# Patient Record
Sex: Male | Born: 1975 | Race: White | Hispanic: No | Marital: Married
Health system: Southern US, Community
[De-identification: ages and names within clinical notes are randomized; demographics above are authoritative.]

---

## 2012-10-25 ENCOUNTER — Other Ambulatory Visit: Payer: Self-pay | Admitting: Family Medicine

## 2012-10-25 DIAGNOSIS — D367 Benign neoplasm of other specified sites: Secondary | ICD-10-CM

## 2012-10-31 ENCOUNTER — Ambulatory Visit
Admission: RE | Admit: 2012-10-31 | Discharge: 2012-10-31 | Disposition: A | Discharge status: Home / Self Care | Payer: BC Managed Care – PPO | Source: Ambulatory Visit | Attending: Family Medicine | Admitting: Family Medicine

## 2012-10-31 DIAGNOSIS — D367 Benign neoplasm of other specified sites: Secondary | ICD-10-CM

## 2016-12-24 DIAGNOSIS — H0015 Chalazion left lower eyelid: Secondary | ICD-10-CM | POA: Diagnosis not present

## 2017-01-17 DIAGNOSIS — H0015 Chalazion left lower eyelid: Secondary | ICD-10-CM | POA: Diagnosis not present

## 2017-01-25 DIAGNOSIS — M25512 Pain in left shoulder: Secondary | ICD-10-CM | POA: Diagnosis not present

## 2017-02-07 DIAGNOSIS — H0015 Chalazion left lower eyelid: Secondary | ICD-10-CM | POA: Diagnosis not present

## 2017-03-24 DIAGNOSIS — Z9181 History of falling: Secondary | ICD-10-CM | POA: Diagnosis not present

## 2017-03-24 DIAGNOSIS — M25511 Pain in right shoulder: Secondary | ICD-10-CM | POA: Diagnosis not present

## 2017-04-26 ENCOUNTER — Other Ambulatory Visit: Payer: Self-pay | Admitting: Orthopedic Surgery

## 2017-04-26 DIAGNOSIS — M25511 Pain in right shoulder: Secondary | ICD-10-CM

## 2017-05-04 ENCOUNTER — Ambulatory Visit
Admission: RE | Admit: 2017-05-04 | Discharge: 2017-05-04 | Disposition: A | Discharge status: Home / Self Care | Payer: BLUE CROSS/BLUE SHIELD | Source: Ambulatory Visit | Attending: Orthopedic Surgery | Admitting: Orthopedic Surgery

## 2017-05-04 DIAGNOSIS — M25511 Pain in right shoulder: Secondary | ICD-10-CM

## 2017-05-12 DIAGNOSIS — S43431A Superior glenoid labrum lesion of right shoulder, initial encounter: Secondary | ICD-10-CM | POA: Insufficient documentation

## 2017-05-19 DIAGNOSIS — M25511 Pain in right shoulder: Secondary | ICD-10-CM | POA: Diagnosis not present

## 2017-05-19 DIAGNOSIS — S43431A Superior glenoid labrum lesion of right shoulder, initial encounter: Secondary | ICD-10-CM | POA: Diagnosis not present

## 2017-12-15 DIAGNOSIS — J208 Acute bronchitis due to other specified organisms: Secondary | ICD-10-CM | POA: Diagnosis not present

## 2017-12-15 DIAGNOSIS — H6983 Other specified disorders of Eustachian tube, bilateral: Secondary | ICD-10-CM | POA: Diagnosis not present

## 2018-05-31 DIAGNOSIS — M7662 Achilles tendinitis, left leg: Secondary | ICD-10-CM | POA: Diagnosis not present

## 2018-07-15 IMAGING — MR MR SHOULDER*R* W/O CM
5 series · 31 of 40 positions shown · non-contrast
Comparison: None.

CLINICAL DATA: Right shoulder pain since a fall in [REDACTED] wall
skiing.

EXAM:
MRI OF THE RIGHT SHOULDER WITHOUT CONTRAST
TECHNIQUE: Multiplanar, multisequence MR imaging of the shoulder was performed.
No intravenous contrast was administered.

[Series 3: T2 fat-sat · axial · 4.0mm · 0.55mm/px · z∈[-21,+69]mm · 8 of 21 slices shown (1 of 3)]
[im 1/21]
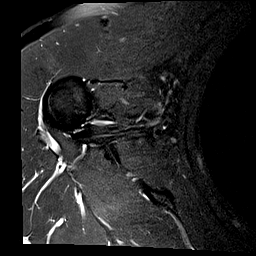
[im 3/21]
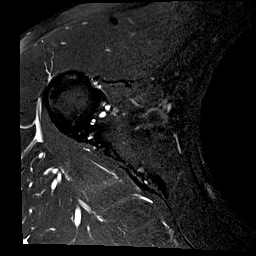
[im 7/21]
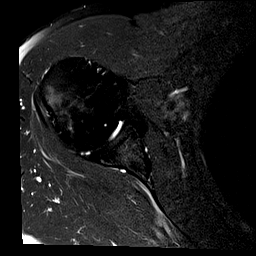
[im 9/21]
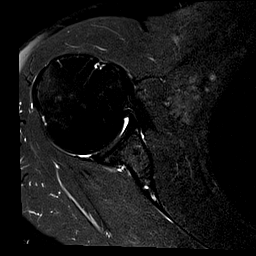
[im 12/21]
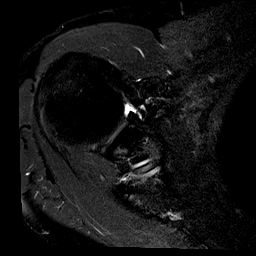
[im 14/21]
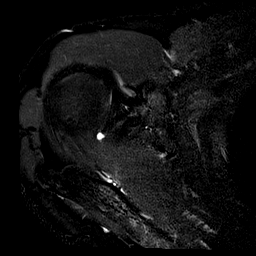
[im 18/21]
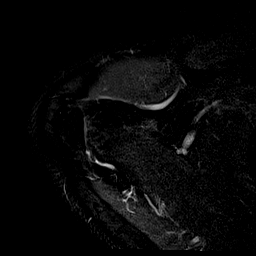
[im 21/21]
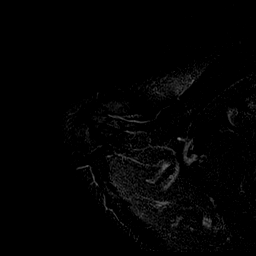

[Series 4: T1 · oblique · 4.0mm · 0.23mm/px · 1 of 20 slices shown]
[im 1/20]
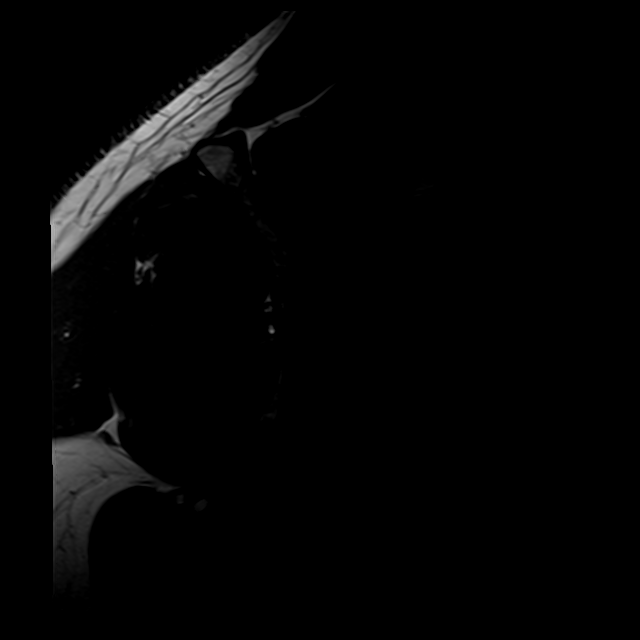

[Series 5: T2 fat-sat · oblique · 4.0mm · 0.59mm/px · 8 of 20 slices shown (2 of 3)]
[im 1/20]
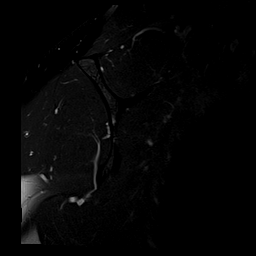
[im 3/20]
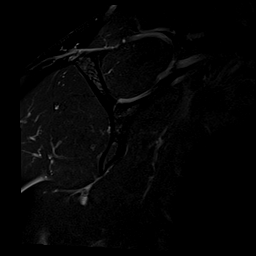
[im 6/20]
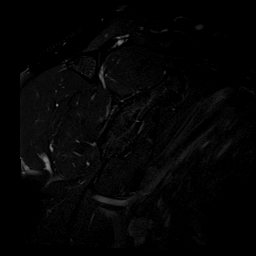
[im 9/20]
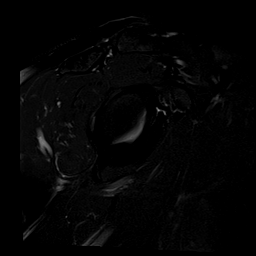
[im 11/20]
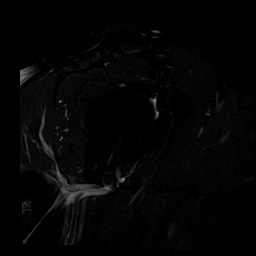
[im 14/20]
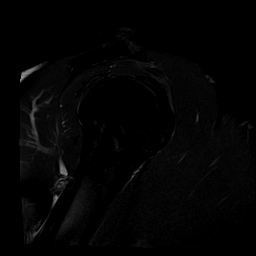
[im 17/20]
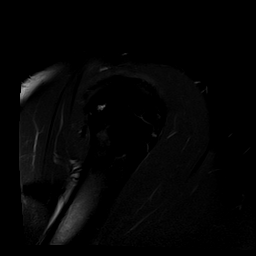
[im 20/20]
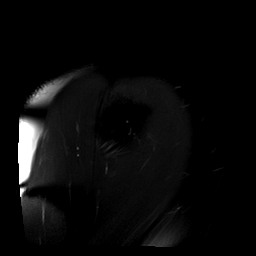

[Series 6: T2 fat-sat · oblique · 4.0mm · 0.59mm/px · 7 of 18 slices shown (3 of 3)]
[im 1/18]
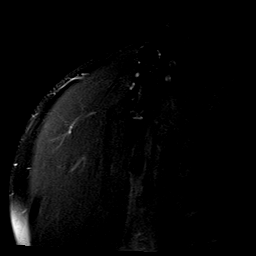
[im 3/18]
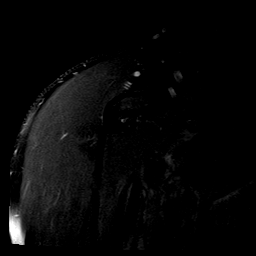
[im 6/18]
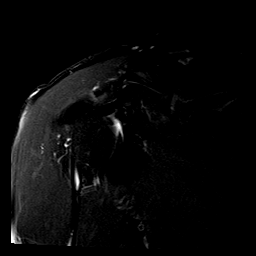
[im 9/18]
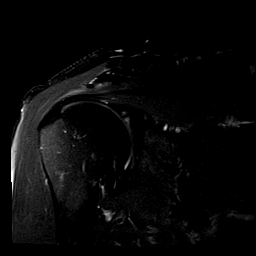
[im 12/18]
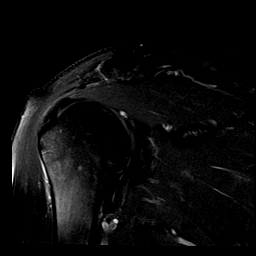
[im 15/18]
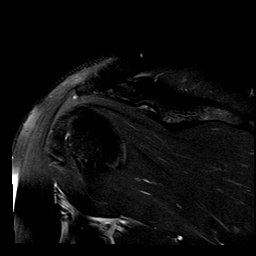
[im 18/18]
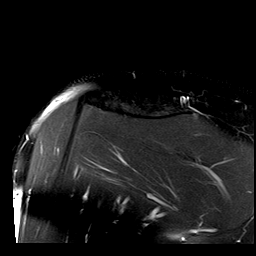

[Series 7: PD · oblique · 4.0mm · 0.23mm/px · 7 of 18 slices shown]
[im 1/18]
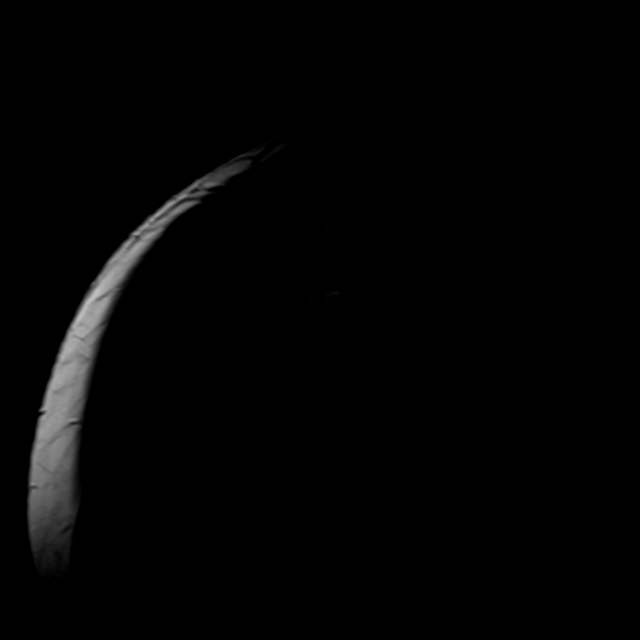
[im 3/18]
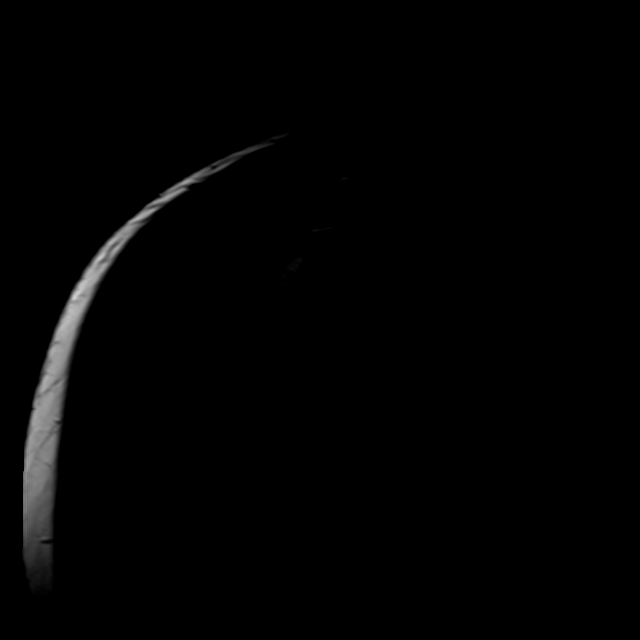
[im 6/18]
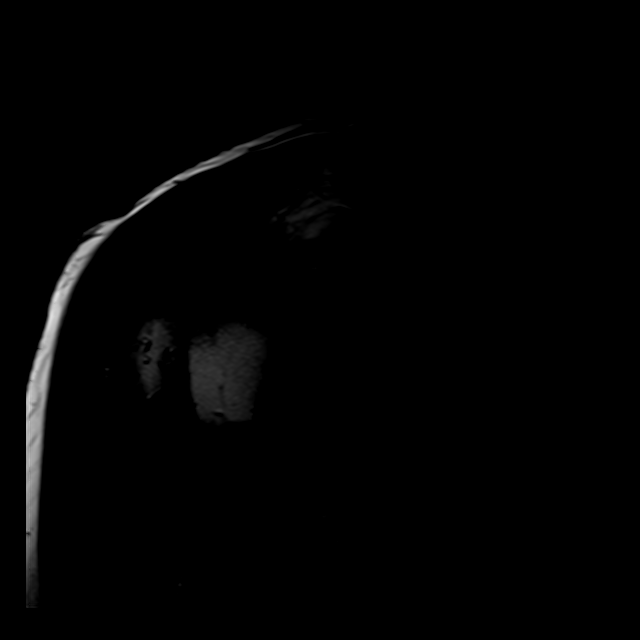
[im 9/18]
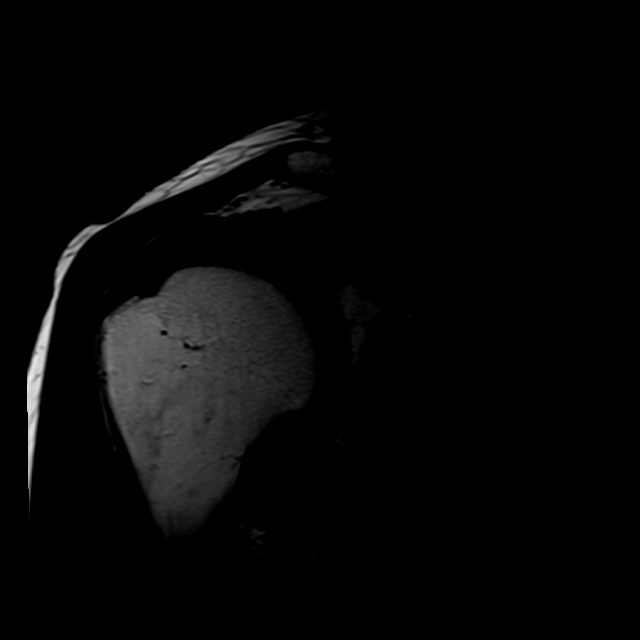
[im 12/18]
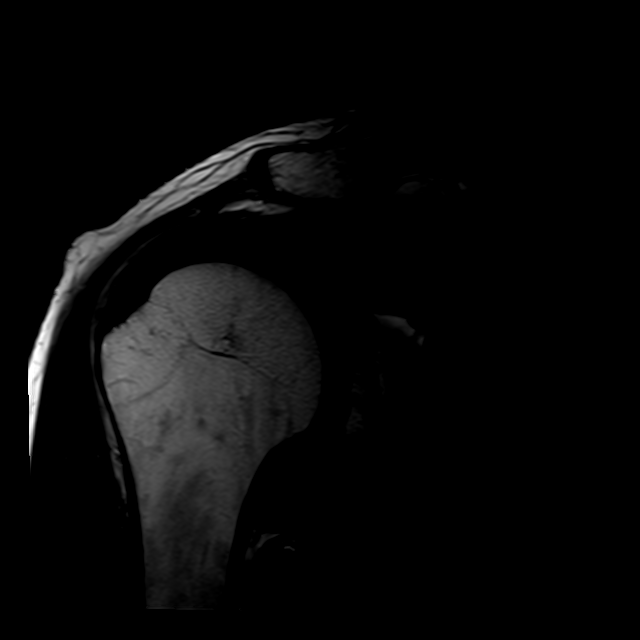
[im 15/18]
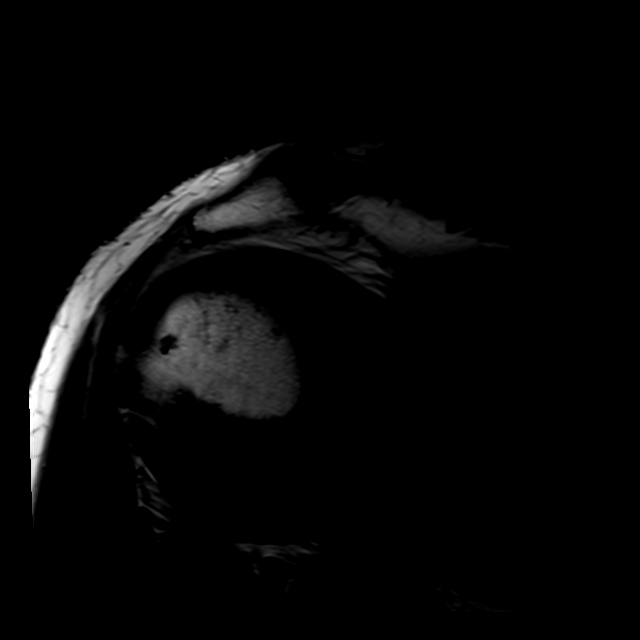
[im 18/18]
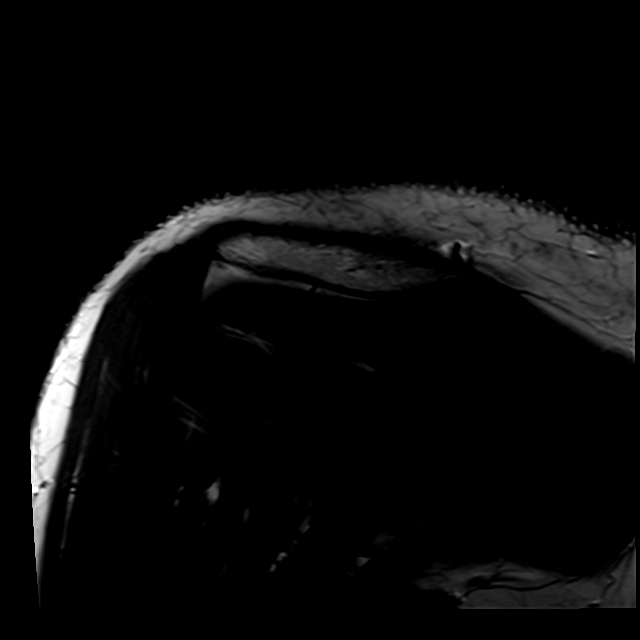

[31 of 40 positions shown; findings below may reference images not displayed]

FINDINGS: Rotator cuff: Mild rotator cuff tendinopathy/tendinosis with small
interstitial tears and mild bursal and articular surface fraying and
fibrillation. No partial or full-thickness rotator cuff tear is
identified.

Muscles:  Normal

Biceps long head:  Intact

Acromioclavicular Joint: No degenerative changes type 1 acromion.
Mild lateral downsloping but no undersurface spurring.

Glenohumeral Joint: No degenerative changes or joint effusion. There
is thickening of the capsular structures in the axillary recess
which can be seen with adhesive capsulitis or synovitis.

Labrum: There is a posterosuperior labral tear with a small
paralabral cyst. The anterior labrum appears normal.

Bones:  No acute bony findings.

Other: Mild subacromial/subdeltoid bursitis.
IMPRESSION: 1. Mild rotator cuff tendinopathy/tendinosis but no partial or
full-thickness tear.
2. Posterosuperior labral tear with associated small paralabral
cyst.
3. There is thickening of the capsular structures in the axillary
recess which can be seen with adhesive capsulitis or synovitis.
4. No acute bony findings.
5. Mild subacromial/subdeltoid bursitis.

## 2018-09-20 DIAGNOSIS — M6788 Other specified disorders of synovium and tendon, other site: Secondary | ICD-10-CM | POA: Diagnosis not present

## 2018-09-20 DIAGNOSIS — R102 Pelvic and perineal pain: Secondary | ICD-10-CM | POA: Diagnosis not present

## 2018-09-20 DIAGNOSIS — Z23 Encounter for immunization: Secondary | ICD-10-CM | POA: Diagnosis not present

## 2018-09-27 ENCOUNTER — Ambulatory Visit: Payer: Self-pay

## 2018-09-27 ENCOUNTER — Encounter: Payer: Self-pay | Admitting: Family Medicine

## 2018-09-27 ENCOUNTER — Other Ambulatory Visit: Payer: Self-pay

## 2018-09-27 ENCOUNTER — Ambulatory Visit (INDEPENDENT_AMBULATORY_CARE_PROVIDER_SITE_OTHER): Payer: BC Managed Care – PPO | Admitting: Family Medicine

## 2018-09-27 VITALS — BP 122/82 | Ht 72.0 in | Wt 200.0 lb

## 2018-09-27 DIAGNOSIS — S86002A Unspecified injury of left Achilles tendon, initial encounter: Secondary | ICD-10-CM

## 2018-09-27 NOTE — Patient Instructions (Signed)
You have mild Achilles Tendinopathy Ibuprofen 600mg  three times a day with food OR aleve 2 tabs twice a day with food for pain and inflammation only if needed. Calf raises 3 sets of 10 on level ground once a day first. When these are easy, can do them one legged 3 sets of 10. Finally advance to doing them on a step dropping and raising. Can add heel walks, toe walks forward and backward as well Icing 15 minutes at a time as needed. Avoid uneven ground, hills as much as possible. Heel lifts in shoes or shoes with a natural heel lift can be helpful if this worsens. Consider physical therapy, orthotics, nitro patches if not improving as expected. Follow up in 6 weeks.

## 2018-09-27 NOTE — Progress Notes (Signed)
PCP: Patient, No Pcp Per  Subjective:   HPI: Patient is a 43 y.o. male here for evaluation of left Achilles pain.  Patient's pain has been present for the last 6 months.  He denies any specific injury or trauma but is a runner which he believes aggravated his symptoms.  Patient got new running shoes in March and also increased the amount of mileage he was running.  He has since taken several months off of running to rest his Achilles.  He notes his pain is improved significantly but comes in today to make sure there is no partial tear or that he will not further injure his Achilles by starting to run again.  The pain is located posteriorly in his ankle and does not radiate.  He denies any bruising or swelling.  He has no associated numbness or tingling.  He is not taking anything for the pain is not done any sort of therapy for his Achilles other than rest.   Review of Systems: See HPI above.  History reviewed. No pertinent past medical history.  No current outpatient medications on file prior to visit.   No current facility-administered medications on file prior to visit.     History reviewed. No pertinent surgical history.  Allergies  Allergen Reactions  . Penicillins Other (See Comments) and Rash    RAsh     Social History   Socioeconomic History  . Marital status: Married    Spouse name: Not on file  . Number of children: Not on file  . Years of education: Not on file  . Highest education level: Not on file  Occupational History  . Not on file  Social Needs  . Financial resource strain: Not on file  . Food insecurity    Worry: Not on file    Inability: Not on file  . Transportation needs    Medical: Not on file    Non-medical: Not on file  Tobacco Use  . Smoking status: Not on file  Substance and Sexual Activity  . Alcohol use: Not on file  . Drug use: Not on file  . Sexual activity: Not on file  Lifestyle  . Physical activity    Days per week: Not on file   Minutes per session: Not on file  . Stress: Not on file  Relationships  . Social Herbalist on phone: Not on file    Gets together: Not on file    Attends religious service: Not on file    Active member of club or organization: Not on file    Attends meetings of clubs or organizations: Not on file    Relationship status: Not on file  . Intimate partner violence    Fear of current or ex partner: Not on file    Emotionally abused: Not on file    Physically abused: Not on file    Forced sexual activity: Not on file  Other Topics Concern  . Not on file  Social History Narrative  . Not on file    History reviewed. No pertinent family history.      Objective:  Physical Exam: BP 122/82   Ht 6' (1.829 m)   Wt 200 lb (90.7 kg)   BMI 27.12 kg/m  Gen: NAD, comfortable in exam room Lungs: Breathing comfortably on room air Ankle/Foot Exam Left -Inspection: No deformity, no discoloration. Normal longitudinal and transverse arch -Palpation: No tenderness over the Achilles tendon -ROM: Dorsiflexion: 20 degrees; plantarflexion: 50  degrees; Inversion: 35 degrees; Eversion: 25 degrees -Strength: Dorsiflexion: 5/5; Plantarflexion: 5/5; Inversion: 5/5; Eversion: 5/5 -Special Tests: Anterior drawer: negative; Talar tilt: Negative; Thompson test: Normal movement and plantarflexion -Limb neurovascularly intact  Contralateral Ankle -Inspection: No deformity, no discoloration. Normal longitudinal and transverse arch -Palpation: Medial malleolus: non-tender; lateral malleolus: non-tender; 5th metatarsal: non-tender; calcaneus: non-tender; plantar fascia insertion: non-tender -ROM: Dorsiflexion: 20 degrees; plantarflexion: 50 degrees; Inversion: 35 degrees; Eversion: 25 degrees -Strength: Dorsiflexion: 5/5; Plantarflexion: 5/5; Inversion: 5/5; Eversion: 5/5 -Limb neurovascularly intact  -Gait: Normal    Limited diagnostic ultrasound of the left Achilles Findings: - Normal  appearance of the Achilles tendon.  No tears noted -Very small amount of retrocalcaneal bursa swelling - No neovascularization in the Achilles tendon -Achilles tendon measured 0.5 cm in thickness - Spurring noted at the posterior aspect of the calcaneus Impression: - Bone spurs noted at the calcaneus, otherwise normal ultrasound exam   Assessment & Plan:  Patient is a 43 y.o. male here for evaluation of left Achilles pain  1.  Left Achilles tendinopathy - No tears seen on ultrasound -Patient given eccentric exercises for strengthening of his Achilles -Patient instructed to avoid running on hills if possible.  Patient may gradually return to running as tolerated -Patient may take anti-inflammatories as needed for pain -If patient does not have any improvement in his symptoms we will consider adding nitroglycerin patch to his current treatment  Follow-up in 4 to 6 weeks as needed

## 2018-09-28 ENCOUNTER — Encounter: Payer: Self-pay | Admitting: Family Medicine

## 2018-11-01 ENCOUNTER — Other Ambulatory Visit: Payer: Self-pay

## 2018-11-01 ENCOUNTER — Ambulatory Visit: Payer: BC Managed Care – PPO | Admitting: Family Medicine

## 2018-11-01 VITALS — BP 120/76 | Ht 72.0 in | Wt 200.0 lb

## 2018-11-01 DIAGNOSIS — M7662 Achilles tendinitis, left leg: Secondary | ICD-10-CM | POA: Diagnosis not present

## 2018-11-01 MED ORDER — NITROGLYCERIN 0.2 MG/HR TD PT24: MEDICATED_PATCH | TRANSDERMAL | 1 refills | Status: DC

## 2018-11-01 NOTE — Patient Instructions (Signed)
Start nitro patches 1/4th patch to affected area, change daily. Heel lifts when up and walking around to help unload the area. Do the home exercises daily. Be more diligent with the icing. Follow up with me in 6 weeks. Let me know if you want to go ahead with physical therapy before I see you back.

## 2018-11-01 NOTE — Progress Notes (Signed)
PCP: Patient, No Pcp Per  Subjective:   HPI: 9/23: Patient is a 43 y.o. male here for evaluation of left Achilles pain.  Patient's pain has been present for the last 6 months.  He denies any specific injury or trauma but is a runner which he believes aggravated his symptoms.  Patient got new running shoes in March and also increased the amount of mileage he was running.  He has since taken several months off of running to rest his Achilles.  He notes his pain is improved significantly but comes in today to make sure there is no partial tear or that he will not further injure his Achilles by starting to run again.  The pain is located posteriorly in his ankle and does not radiate.  He denies any bruising or swelling.  He has no associated numbness or tingling.  He is not taking anything for the pain is not done any sort of therapy for his Achilles other than rest.  10/28: Patient reports he's doing well. Doing home exercises every other day. Still with tightness and pain in his achilles - feels he can be more diligent about icing and home exercises. Pain doesn't stop him from running. No swelling, skin changes.  Review of Systems: See HPI above.  History reviewed. No pertinent past medical history.  No current outpatient medications on file prior to visit.   No current facility-administered medications on file prior to visit.     History reviewed. No pertinent surgical history.  Allergies  Allergen Reactions  . Penicillins Other (See Comments) and Rash    RAsh     Social History   Socioeconomic History  . Marital status: Married    Spouse name: Not on file  . Number of children: Not on file  . Years of education: Not on file  . Highest education level: Not on file  Occupational History  . Not on file  Social Needs  . Financial resource strain: Not on file  . Food insecurity    Worry: Not on file    Inability: Not on file  . Transportation needs    Medical: Not on file     Non-medical: Not on file  Tobacco Use  . Smoking status: Not on file  Substance and Sexual Activity  . Alcohol use: Not on file  . Drug use: Not on file  . Sexual activity: Not on file  Lifestyle  . Physical activity    Days per week: Not on file    Minutes per session: Not on file  . Stress: Not on file  Relationships  . Social Herbalist on phone: Not on file    Gets together: Not on file    Attends religious service: Not on file    Active member of club or organization: Not on file    Attends meetings of clubs or organizations: Not on file    Relationship status: Not on file  . Intimate partner violence    Fear of current or ex partner: Not on file    Emotionally abused: Not on file    Physically abused: Not on file    Forced sexual activity: Not on file  Other Topics Concern  . Not on file  Social History Narrative  . Not on file    History reviewed. No pertinent family history.      Objective:  Physical Exam: BP 120/76   Ht 6' (1.829 m)   Wt 200 lb (90.7  kg)   BMI 27.12 kg/m   Gen: NAD, comfortable in exam room  Left foot/ankle: No gross deformity, swelling, ecchymoses FROM with 5/5 strength without pain. Minimal TTP achilles 2-3 cm proximal to insertion. Negative ant drawer and talar tilt.   Thompsons test negative. NV intact distally.   Assessment & Plan:  Patient is a 43 y.o. male here for evaluation of left Achilles pain  1.  Left achilles tendinopathy - discussed options.  He will continue with HEP but will add heel lifts, nitro patches (discussed risks of headache, skin irritation).  Consider physical therapy.  Icing, tylenol, ibuprofen if needed.  F/u in 6 weeks.

## 2018-11-02 ENCOUNTER — Encounter: Payer: Self-pay | Admitting: Family Medicine

## 2019-01-02 ENCOUNTER — Other Ambulatory Visit: Payer: Self-pay | Admitting: Family Medicine

## 2019-01-02 DIAGNOSIS — J019 Acute sinusitis, unspecified: Secondary | ICD-10-CM | POA: Diagnosis not present

## 2019-01-22 ENCOUNTER — Other Ambulatory Visit: Payer: Self-pay | Admitting: Family Medicine

## 2019-02-01 DIAGNOSIS — Z0000 Encounter for general adult medical examination without abnormal findings: Secondary | ICD-10-CM | POA: Diagnosis not present

## 2019-02-02 DIAGNOSIS — Z20828 Contact with and (suspected) exposure to other viral communicable diseases: Secondary | ICD-10-CM | POA: Diagnosis not present

## 2019-02-02 DIAGNOSIS — Z20822 Contact with and (suspected) exposure to covid-19: Secondary | ICD-10-CM | POA: Diagnosis not present

## 2019-02-15 DIAGNOSIS — Z1329 Encounter for screening for other suspected endocrine disorder: Secondary | ICD-10-CM | POA: Diagnosis not present

## 2019-02-15 DIAGNOSIS — Z23 Encounter for immunization: Secondary | ICD-10-CM | POA: Diagnosis not present

## 2019-02-15 DIAGNOSIS — Z125 Encounter for screening for malignant neoplasm of prostate: Secondary | ICD-10-CM | POA: Diagnosis not present

## 2019-02-15 DIAGNOSIS — Z789 Other specified health status: Secondary | ICD-10-CM | POA: Diagnosis not present

## 2019-02-15 DIAGNOSIS — Z13220 Encounter for screening for lipoid disorders: Secondary | ICD-10-CM | POA: Diagnosis not present

## 2019-03-06 DIAGNOSIS — E871 Hypo-osmolality and hyponatremia: Secondary | ICD-10-CM | POA: Diagnosis not present

## 2019-03-27 DIAGNOSIS — D225 Melanocytic nevi of trunk: Secondary | ICD-10-CM | POA: Diagnosis not present

## 2019-03-27 DIAGNOSIS — L814 Other melanin hyperpigmentation: Secondary | ICD-10-CM | POA: Diagnosis not present

## 2019-03-27 DIAGNOSIS — D1801 Hemangioma of skin and subcutaneous tissue: Secondary | ICD-10-CM | POA: Diagnosis not present

## 2019-03-27 DIAGNOSIS — D2261 Melanocytic nevi of right upper limb, including shoulder: Secondary | ICD-10-CM | POA: Diagnosis not present

## 2019-03-27 DIAGNOSIS — B081 Molluscum contagiosum: Secondary | ICD-10-CM | POA: Diagnosis not present

## 2019-04-02 DIAGNOSIS — Z20822 Contact with and (suspected) exposure to covid-19: Secondary | ICD-10-CM | POA: Diagnosis not present

## 2019-11-15 DIAGNOSIS — H6983 Other specified disorders of Eustachian tube, bilateral: Secondary | ICD-10-CM | POA: Diagnosis not present

## 2019-11-15 DIAGNOSIS — H93293 Other abnormal auditory perceptions, bilateral: Secondary | ICD-10-CM | POA: Diagnosis not present

## 2019-11-15 DIAGNOSIS — R040 Epistaxis: Secondary | ICD-10-CM | POA: Diagnosis not present

## 2019-12-06 DIAGNOSIS — G5621 Lesion of ulnar nerve, right upper limb: Secondary | ICD-10-CM | POA: Diagnosis not present

## 2019-12-06 DIAGNOSIS — M7701 Medial epicondylitis, right elbow: Secondary | ICD-10-CM | POA: Diagnosis not present

## 2019-12-26 DIAGNOSIS — Z03818 Encounter for observation for suspected exposure to other biological agents ruled out: Secondary | ICD-10-CM | POA: Diagnosis not present

## 2019-12-26 DIAGNOSIS — Z20822 Contact with and (suspected) exposure to covid-19: Secondary | ICD-10-CM | POA: Diagnosis not present

## 2020-02-07 DIAGNOSIS — Z0000 Encounter for general adult medical examination without abnormal findings: Secondary | ICD-10-CM | POA: Diagnosis not present

## 2020-02-21 DIAGNOSIS — Z0000 Encounter for general adult medical examination without abnormal findings: Secondary | ICD-10-CM | POA: Diagnosis not present

## 2020-02-21 DIAGNOSIS — Z13220 Encounter for screening for lipoid disorders: Secondary | ICD-10-CM | POA: Diagnosis not present

## 2020-02-21 DIAGNOSIS — Z125 Encounter for screening for malignant neoplasm of prostate: Secondary | ICD-10-CM | POA: Diagnosis not present

## 2020-03-26 DIAGNOSIS — D224 Melanocytic nevi of scalp and neck: Secondary | ICD-10-CM | POA: Diagnosis not present

## 2020-03-26 DIAGNOSIS — D225 Melanocytic nevi of trunk: Secondary | ICD-10-CM | POA: Diagnosis not present

## 2020-03-26 DIAGNOSIS — D2262 Melanocytic nevi of left upper limb, including shoulder: Secondary | ICD-10-CM | POA: Diagnosis not present

## 2020-03-26 DIAGNOSIS — D2261 Melanocytic nevi of right upper limb, including shoulder: Secondary | ICD-10-CM | POA: Diagnosis not present

## 2020-06-10 DIAGNOSIS — R0982 Postnasal drip: Secondary | ICD-10-CM | POA: Diagnosis not present

## 2021-03-26 DIAGNOSIS — D225 Melanocytic nevi of trunk: Secondary | ICD-10-CM | POA: Diagnosis not present

## 2021-03-26 DIAGNOSIS — L814 Other melanin hyperpigmentation: Secondary | ICD-10-CM | POA: Diagnosis not present

## 2021-03-26 DIAGNOSIS — D2261 Melanocytic nevi of right upper limb, including shoulder: Secondary | ICD-10-CM | POA: Diagnosis not present

## 2021-03-30 DIAGNOSIS — M25511 Pain in right shoulder: Secondary | ICD-10-CM | POA: Diagnosis not present

## 2021-04-21 DIAGNOSIS — M25511 Pain in right shoulder: Secondary | ICD-10-CM | POA: Diagnosis not present

## 2021-05-04 DIAGNOSIS — M25511 Pain in right shoulder: Secondary | ICD-10-CM | POA: Diagnosis not present

## 2021-05-20 DIAGNOSIS — Z130 Encounter for screening for diseases of the blood and blood-forming organs and certain disorders involving the immune mechanism: Secondary | ICD-10-CM | POA: Diagnosis not present

## 2021-05-20 DIAGNOSIS — Z13220 Encounter for screening for lipoid disorders: Secondary | ICD-10-CM | POA: Diagnosis not present

## 2021-05-20 DIAGNOSIS — Z0000 Encounter for general adult medical examination without abnormal findings: Secondary | ICD-10-CM | POA: Diagnosis not present

## 2021-05-20 DIAGNOSIS — Z125 Encounter for screening for malignant neoplasm of prostate: Secondary | ICD-10-CM | POA: Diagnosis not present

## 2021-05-20 DIAGNOSIS — R350 Frequency of micturition: Secondary | ICD-10-CM | POA: Diagnosis not present

## 2021-06-04 DIAGNOSIS — M545 Low back pain, unspecified: Secondary | ICD-10-CM | POA: Insufficient documentation

## 2022-01-19 DIAGNOSIS — Z1211 Encounter for screening for malignant neoplasm of colon: Secondary | ICD-10-CM | POA: Diagnosis not present

## 2022-01-19 DIAGNOSIS — K5730 Diverticulosis of large intestine without perforation or abscess without bleeding: Secondary | ICD-10-CM | POA: Diagnosis not present

## 2022-04-01 DIAGNOSIS — D2262 Melanocytic nevi of left upper limb, including shoulder: Secondary | ICD-10-CM | POA: Diagnosis not present

## 2022-04-01 DIAGNOSIS — D225 Melanocytic nevi of trunk: Secondary | ICD-10-CM | POA: Diagnosis not present

## 2022-04-01 DIAGNOSIS — D2271 Melanocytic nevi of right lower limb, including hip: Secondary | ICD-10-CM | POA: Diagnosis not present

## 2022-04-01 DIAGNOSIS — D2261 Melanocytic nevi of right upper limb, including shoulder: Secondary | ICD-10-CM | POA: Diagnosis not present

## 2022-07-07 DIAGNOSIS — S2239XB Fracture of one rib, unspecified side, initial encounter for open fracture: Secondary | ICD-10-CM | POA: Diagnosis not present

## 2022-07-12 DIAGNOSIS — S2239XB Fracture of one rib, unspecified side, initial encounter for open fracture: Secondary | ICD-10-CM | POA: Diagnosis not present

## 2022-07-14 ENCOUNTER — Other Ambulatory Visit (HOSPITAL_COMMUNITY): Payer: Self-pay | Admitting: Orthopedic Surgery

## 2022-07-14 DIAGNOSIS — M79603 Pain in arm, unspecified: Secondary | ICD-10-CM | POA: Insufficient documentation

## 2022-07-14 DIAGNOSIS — I82611 Acute embolism and thrombosis of superficial veins of right upper extremity: Secondary | ICD-10-CM | POA: Diagnosis not present

## 2022-07-14 DIAGNOSIS — M79601 Pain in right arm: Secondary | ICD-10-CM

## 2022-07-14 DIAGNOSIS — S2242XA Multiple fractures of ribs, left side, initial encounter for closed fracture: Secondary | ICD-10-CM | POA: Diagnosis not present

## 2022-07-14 DIAGNOSIS — M79602 Pain in left arm: Secondary | ICD-10-CM | POA: Diagnosis not present

## 2022-07-15 ENCOUNTER — Ambulatory Visit (HOSPITAL_COMMUNITY)
Admission: RE | Admit: 2022-07-15 | Discharge: 2022-07-15 | Disposition: A | Discharge status: Home / Self Care | Payer: BC Managed Care – PPO | Source: Ambulatory Visit | Attending: Orthopedic Surgery | Admitting: Orthopedic Surgery

## 2022-07-15 ENCOUNTER — Other Ambulatory Visit: Payer: Self-pay

## 2022-07-15 DIAGNOSIS — S2242XA Multiple fractures of ribs, left side, initial encounter for closed fracture: Secondary | ICD-10-CM

## 2022-07-15 DIAGNOSIS — S2249XA Multiple fractures of ribs, unspecified side, initial encounter for closed fracture: Secondary | ICD-10-CM | POA: Insufficient documentation

## 2022-07-15 DIAGNOSIS — M79601 Pain in right arm: Secondary | ICD-10-CM | POA: Diagnosis not present

## 2022-07-15 DIAGNOSIS — I82619 Acute embolism and thrombosis of superficial veins of unspecified upper extremity: Secondary | ICD-10-CM | POA: Insufficient documentation

## 2022-07-15 NOTE — Progress Notes (Signed)
Right upper extremity venous study completed.   Preliminary results relayed to [facility] at Dr. Ranell Patrick' office.  Please see CV Procedures for preliminary results.  Crystina Borrayo, RVT  11:03 AM 07/15/22

## 2022-07-16 ENCOUNTER — Other Ambulatory Visit: Payer: Self-pay | Admitting: Physician Assistant

## 2022-07-16 DIAGNOSIS — S2242XA Multiple fractures of ribs, left side, initial encounter for closed fracture: Secondary | ICD-10-CM

## 2022-07-21 ENCOUNTER — Other Ambulatory Visit: Payer: BC Managed Care – PPO

## 2022-07-22 ENCOUNTER — Ambulatory Visit
Admission: RE | Admit: 2022-07-22 | Discharge: 2022-07-22 | Disposition: A | Discharge status: Home / Self Care | Payer: BC Managed Care – PPO | Source: Ambulatory Visit | Attending: Physician Assistant | Admitting: Physician Assistant

## 2022-07-22 DIAGNOSIS — S2242XA Multiple fractures of ribs, left side, initial encounter for closed fracture: Secondary | ICD-10-CM

## 2022-07-22 DIAGNOSIS — S2249XA Multiple fractures of ribs, unspecified side, initial encounter for closed fracture: Secondary | ICD-10-CM | POA: Diagnosis not present

## 2022-07-22 DIAGNOSIS — M549 Dorsalgia, unspecified: Secondary | ICD-10-CM | POA: Diagnosis not present

## 2022-07-23 ENCOUNTER — Other Ambulatory Visit: Payer: Self-pay | Admitting: Cardiothoracic Surgery

## 2022-07-23 DIAGNOSIS — S2242XA Multiple fractures of ribs, left side, initial encounter for closed fracture: Secondary | ICD-10-CM

## 2022-07-26 ENCOUNTER — Ambulatory Visit
Admission: RE | Admit: 2022-07-26 | Discharge: 2022-07-26 | Disposition: A | Discharge status: Home / Self Care | Payer: BC Managed Care – PPO | Source: Ambulatory Visit | Attending: Cardiothoracic Surgery | Admitting: Cardiothoracic Surgery

## 2022-07-26 ENCOUNTER — Institutional Professional Consult (permissible substitution): Payer: BC Managed Care – PPO | Admitting: Cardiothoracic Surgery

## 2022-07-26 ENCOUNTER — Encounter: Payer: Self-pay | Admitting: Cardiothoracic Surgery

## 2022-07-26 VITALS — BP 130/87 | HR 71 | Resp 18 | Ht 72.0 in | Wt 196.0 lb

## 2022-07-26 DIAGNOSIS — R0781 Pleurodynia: Secondary | ICD-10-CM

## 2022-07-26 DIAGNOSIS — R0789 Other chest pain: Secondary | ICD-10-CM | POA: Insufficient documentation

## 2022-07-26 DIAGNOSIS — S2249XA Multiple fractures of ribs, unspecified side, initial encounter for closed fracture: Secondary | ICD-10-CM | POA: Diagnosis not present

## 2022-07-26 DIAGNOSIS — S2242XA Multiple fractures of ribs, left side, initial encounter for closed fracture: Secondary | ICD-10-CM

## 2022-07-26 MED ORDER — LIDOCAINE 5 % EX PTCH: 1.0000 | MEDICATED_PATCH | CUTANEOUS | 1 refills | Status: DC

## 2022-07-26 MED ORDER — TRAMADOL HCL 50 MG PO TABS: 50.0000 mg | ORAL_TABLET | Freq: Four times a day (QID) | ORAL | 1 refills | Status: DC | PRN

## 2022-07-26 NOTE — Progress Notes (Signed)
HPI: Patient examined, images of recent chest CT scans performed both in [place] and locally personally reviewed and discussed with patient. On July 3 the patient while on vacation in [place] slipped on a catamarran landing on a stairway with injury to his left lower back.  He sustained minimally displaced fractures of the left eighth and ninth ribs medially close to the spine.  There is some pleural thickening and mild localized effusion at the site of injury but no significant pleural effusion or pneumothorax.  A follow-up chest x-ray today again shows clear lung fields.  The patient has some clicking and popping sensation the area of injury.  He has some injury related difficulties with deep inspiration but can do his normal daily activities.  He has been taking tramadol and Tylenol for pain.  Current Outpatient Medications  Medication Sig Dispense Refill   acetaminophen (TYLENOL) 325 MG tablet Take 650 mg by mouth every 6 (six) hours as needed.     aspirin EC 81 MG tablet Take 81 mg by mouth daily. Swallow whole.     fluticasone (FLONASE) 50 MCG/ACT nasal spray two sprays by Both Nostrils route daily.     traMADol (ULTRAM) 50 MG tablet Take 50 mg by mouth every 6 (six) hours as needed.     No current facility-administered medications for this visit.     Physical Exam: Blood pressure 130/87, pulse 71, resp. rate 18, height 6' (1.829 m), weight 196 lb (88.9 kg), SpO2 96%.         Exam    General- alert and comfortable    Neck- no JVD, no cervical adenopathy palpable, no carotid bruit   Lungs- clear without rales, wheezes   Cor- regular rate and rhythm, no murmur , gallop   Abdomen- soft, non-tender   Extremities - warm, non-tender, minimal edema, indurated area above the right brachial fossa at the site of IV contrast infiltration now without fluctuance erythema or significant tenderness.   Neuro- oriented, appropriate, no focal weakness  Diagnostic Tests: Recent CT scan of chest  shows minimally displaced posterior fractures of the left eighth and ninth ribs.  Impression: Left lower medial rib fractures of ribs 8 9 will heal and do not meet criteria for surgical plating.  Plan on restricting activities as discussed with patient as well as using Tylenol and tramadol for discomfort.  Will also use Lidoderm patch once a day over the area of the injury.  Plan: Return with chest x-ray in 4 weeks.  He will call us if there are any changes or concerns.   Lovett Sox, MD [facility] [phone]

## 2022-08-06 ENCOUNTER — Telehealth: Payer: Self-pay

## 2022-08-06 NOTE — Telephone Encounter (Signed)
Patient contacted the office with questions on prescription of Lidocaine patches. Advised prior authorization has been faxed to [facility]. Also advised that he can use over the counter lidocaine patches until authorization is completed. He acknowledged receipt.

## 2022-08-23 ENCOUNTER — Ambulatory Visit: Payer: BC Managed Care – PPO | Admitting: Cardiothoracic Surgery

## 2022-08-25 ENCOUNTER — Other Ambulatory Visit: Payer: Self-pay | Admitting: Cardiothoracic Surgery

## 2022-08-25 DIAGNOSIS — S2242XA Multiple fractures of ribs, left side, initial encounter for closed fracture: Secondary | ICD-10-CM

## 2022-08-26 ENCOUNTER — Ambulatory Visit: Payer: BC Managed Care – PPO | Admitting: Cardiothoracic Surgery

## 2022-08-26 ENCOUNTER — Ambulatory Visit
Admission: RE | Admit: 2022-08-26 | Discharge: 2022-08-26 | Disposition: A | Discharge status: Home / Self Care | Payer: BC Managed Care – PPO | Source: Ambulatory Visit | Attending: Cardiothoracic Surgery | Admitting: Cardiothoracic Surgery

## 2022-08-26 ENCOUNTER — Encounter: Payer: Self-pay | Admitting: Cardiothoracic Surgery

## 2022-08-26 VITALS — BP 111/78 | HR 74 | Resp 20 | Ht 72.0 in | Wt 195.0 lb

## 2022-08-26 DIAGNOSIS — S2242XD Multiple fractures of ribs, left side, subsequent encounter for fracture with routine healing: Secondary | ICD-10-CM | POA: Diagnosis not present

## 2022-08-26 DIAGNOSIS — J929 Pleural plaque without asbestos: Secondary | ICD-10-CM | POA: Insufficient documentation

## 2022-08-26 DIAGNOSIS — S2242XA Multiple fractures of ribs, left side, initial encounter for closed fracture: Secondary | ICD-10-CM | POA: Diagnosis not present

## 2022-08-26 DIAGNOSIS — J984 Other disorders of lung: Secondary | ICD-10-CM | POA: Diagnosis not present

## 2022-08-26 NOTE — Progress Notes (Signed)
HPI: The patient returns for 93-month follow-up with chest x-ray after falling and fracturing  2 lower left ribs (9, 10). His pain has significantly improved and no longer needs tramadol.  He is doing normal activities but not doing any heavy lifting.  He has been hesitant to lay on his side or prone to sleep but he can safely position himself so that he is comfortable.  He has sensation of some motion in his lower back probably related to the mildly displaced ribs.  I reviewed follow-up chest x-ray today which shows no significant pleural effusion and a small thin linear density in the area of injury probably related to pleural thickening/scar. Current Outpatient Medications  Medication Sig Dispense Refill   acetaminophen (TYLENOL) 325 MG tablet Take 650 mg by mouth every 6 (six) hours as needed.     aspirin EC 81 MG tablet Take 81 mg by mouth daily. Swallow whole.     fluticasone (FLONASE) 50 MCG/ACT nasal spray two sprays by Both Nostrils route daily.     traMADol (ULTRAM) 50 MG tablet Take 1 tablet (50 mg total) by mouth every 6 (six) hours as needed. 30 tablet 1   No current facility-administered medications for this visit.     Physical Exam  :Blood pressure 111/78, pulse 74, resp. rate 20, height 6' (1.829 m), weight 195 lb (88.5 kg), SpO2 96%.        Exam    General- alert and comfortable    Neck- no JVD, no cervical adenopathy palpable, no carotid bruit   Lungs- clear without rales, wheezes   Cor- regular rate and rhythm, no murmur , gallop   Abdomen- soft, non-tender   Extremities - warm, non-tender, minimal edema   Neuro- oriented, appropriate, no focal weakness  Diagnostic Tests: Today's chest x-ray images reviewed as noted above. No significant abnormality. Impression: Patient is going through the process of left lower ribs 9 and 10 healing from small nondisplaced fractures.  He should restrict heavy lifting especially above his shoulders or pushing or pulling heavy  weights.  No restrictions on normal daily activities.  He should wait 3 months to play golf with a full swing.  Plan: Return in 1 month with chest x-ray for monitoring progress.   Lovett Sox, MD [facility] [phone]

## 2022-09-15 ENCOUNTER — Other Ambulatory Visit: Payer: Self-pay | Admitting: Cardiothoracic Surgery

## 2022-09-15 DIAGNOSIS — S2242XA Multiple fractures of ribs, left side, initial encounter for closed fracture: Secondary | ICD-10-CM

## 2022-09-16 ENCOUNTER — Other Ambulatory Visit: Payer: Self-pay | Admitting: Cardiothoracic Surgery

## 2022-09-16 DIAGNOSIS — S2242XA Multiple fractures of ribs, left side, initial encounter for closed fracture: Secondary | ICD-10-CM

## 2022-09-20 ENCOUNTER — Ambulatory Visit
Admission: RE | Admit: 2022-09-20 | Discharge: 2022-09-20 | Disposition: A | Discharge status: Home / Self Care | Payer: BC Managed Care – PPO | Source: Ambulatory Visit | Attending: Cardiothoracic Surgery | Admitting: Cardiothoracic Surgery

## 2022-09-20 ENCOUNTER — Ambulatory Visit: Payer: BC Managed Care – PPO | Admitting: Cardiothoracic Surgery

## 2022-09-20 ENCOUNTER — Encounter: Payer: Self-pay | Admitting: Cardiothoracic Surgery

## 2022-09-20 VITALS — BP 125/84 | HR 63 | Resp 18 | Ht 72.0 in | Wt 199.0 lb

## 2022-09-20 DIAGNOSIS — S2242XA Multiple fractures of ribs, left side, initial encounter for closed fracture: Secondary | ICD-10-CM

## 2022-09-20 DIAGNOSIS — R0781 Pleurodynia: Secondary | ICD-10-CM

## 2022-09-20 NOTE — Progress Notes (Signed)
HPI: Patient returns with scheduled follow-up with chest x-ray or evaluation and treatment of left lower rib fractures of ninth and 10th rib almost 3 months ago.  On previous CT scan these rib fractures were mildly comminuted but minimally displaced and with minimal symptoms.  Patient's symptoms remain mild.  He feels no cracking or popping sensation in his left flank and today's chest x-ray shows resolution of the previous small loculated pleural effusion and area of subsegmental atelectasisof the left lower lobe.  His exam is unremarkable and he is ready to increase his activities.  He understands he can jog and do aerobic activities without limitations.  He understands after October 1 he can golf and start to resume lifting weights.   Physical Exam: Blood pressure 125/84, pulse 63, resp. rate 18, height 6' (1.829 m), weight 199 lb (90.3 kg), SpO2 100%.   Diagnostic Tests:  Today's chest x-ray personally reviewed showing evidence of the healing ninth rib fracture otherwise clear  Impression: Patient recovering well from fractures of the left ninth and 10th ribs.  He can resume normal and athletic activities after October 1.  Plan: Return as needed.   Lovett Sox, MD [facility] [phone]

## 2023-01-24 DIAGNOSIS — M25561 Pain in right knee: Secondary | ICD-10-CM | POA: Diagnosis not present

## 2023-04-04 DIAGNOSIS — D225 Melanocytic nevi of trunk: Secondary | ICD-10-CM | POA: Diagnosis not present

## 2023-04-04 DIAGNOSIS — D2262 Melanocytic nevi of left upper limb, including shoulder: Secondary | ICD-10-CM | POA: Diagnosis not present

## 2023-04-04 DIAGNOSIS — D2261 Melanocytic nevi of right upper limb, including shoulder: Secondary | ICD-10-CM | POA: Diagnosis not present

## 2023-04-04 DIAGNOSIS — L821 Other seborrheic keratosis: Secondary | ICD-10-CM | POA: Diagnosis not present

## 2023-04-27 DIAGNOSIS — R051 Acute cough: Secondary | ICD-10-CM | POA: Diagnosis not present

## 2023-11-04 DIAGNOSIS — Z79899 Other long term (current) drug therapy: Secondary | ICD-10-CM | POA: Diagnosis not present

## 2023-11-04 DIAGNOSIS — Z23 Encounter for immunization: Secondary | ICD-10-CM | POA: Diagnosis not present

## 2023-11-04 DIAGNOSIS — R931 Abnormal findings on diagnostic imaging of heart and coronary circulation: Secondary | ICD-10-CM | POA: Diagnosis not present

## 2023-11-04 DIAGNOSIS — Z1331 Encounter for screening for depression: Secondary | ICD-10-CM | POA: Diagnosis not present

## 2023-11-04 DIAGNOSIS — Z0000 Encounter for general adult medical examination without abnormal findings: Secondary | ICD-10-CM | POA: Diagnosis not present

## 2023-11-04 DIAGNOSIS — Z6827 Body mass index (BMI) 27.0-27.9, adult: Secondary | ICD-10-CM | POA: Diagnosis not present

## 2023-11-04 DIAGNOSIS — J984 Other disorders of lung: Secondary | ICD-10-CM | POA: Diagnosis not present

## 2023-11-04 DIAGNOSIS — R001 Bradycardia, unspecified: Secondary | ICD-10-CM | POA: Diagnosis not present

## 2023-11-04 DIAGNOSIS — F17290 Nicotine dependence, other tobacco product, uncomplicated: Secondary | ICD-10-CM | POA: Diagnosis not present

## 2023-11-04 DIAGNOSIS — E663 Overweight: Secondary | ICD-10-CM | POA: Diagnosis not present

## 2023-11-08 DIAGNOSIS — I2510 Atherosclerotic heart disease of native coronary artery without angina pectoris: Secondary | ICD-10-CM | POA: Diagnosis not present

## 2023-11-08 DIAGNOSIS — E7800 Pure hypercholesterolemia, unspecified: Secondary | ICD-10-CM | POA: Diagnosis not present

## 2023-11-16 ENCOUNTER — Encounter (HOSPITAL_BASED_OUTPATIENT_CLINIC_OR_DEPARTMENT_OTHER): Payer: Self-pay | Admitting: Nurse Practitioner

## 2023-11-16 ENCOUNTER — Ambulatory Visit (HOSPITAL_BASED_OUTPATIENT_CLINIC_OR_DEPARTMENT_OTHER): Admitting: Nurse Practitioner

## 2023-11-16 VITALS — BP 116/84 | HR 63 | Ht 72.0 in | Wt 198.6 lb

## 2023-11-16 DIAGNOSIS — R0609 Other forms of dyspnea: Secondary | ICD-10-CM

## 2023-11-16 DIAGNOSIS — E785 Hyperlipidemia, unspecified: Secondary | ICD-10-CM | POA: Diagnosis not present

## 2023-11-16 DIAGNOSIS — Z7189 Other specified counseling: Secondary | ICD-10-CM

## 2023-11-16 DIAGNOSIS — I259 Chronic ischemic heart disease, unspecified: Secondary | ICD-10-CM | POA: Diagnosis not present

## 2023-11-16 DIAGNOSIS — R0602 Shortness of breath: Secondary | ICD-10-CM | POA: Diagnosis not present

## 2023-11-16 DIAGNOSIS — I2510 Atherosclerotic heart disease of native coronary artery without angina pectoris: Secondary | ICD-10-CM

## 2023-11-16 DIAGNOSIS — Z8249 Family history of ischemic heart disease and other diseases of the circulatory system: Secondary | ICD-10-CM

## 2023-11-16 MED ORDER — METOPROLOL TARTRATE 50 MG PO TABS: 50.0000 mg | ORAL_TABLET | Freq: Once | ORAL | 0 refills | Status: AC

## 2023-11-16 NOTE — Patient Instructions (Signed)
 Medication Instructions:   Your physician recommends that you continue on your current medications as directed. Please refer to the Current Medication list given to you today.   *If you need a refill on your cardiac medications before your next appointment, please call your pharmacy*  Lab Work: Your physician recommends that you have    Lp(a) and ApoB at your primary care doctors, this is fasting lab work.  If you have labs (blood work) drawn today and your tests are completely normal, you will receive your results only by: MyChart Message (if you have MyChart) OR A paper copy in the mail If you have any lab test that is abnormal or we need to change your treatment, we will call you to review the results.  Testing/Procedures:    Your cardiac CT will be scheduled at one of the below locations:    [facility] [address]  [place], [place] [address]   If scheduled at the [facility] at [address], please enter the parking lot using the [address] entrance and use the FREE valet service at the patient drop-off area. Enter the building and check-in with registration on the main floor.  Please follow these instructions carefully (unless otherwise directed):  An IV will be required for this test and Nitroglycerin  will be given.  Hold all erectile dysfunction medications at least 3 days (72 hrs) prior to test. (Ie viagra, cialis, sildenafil, tadalafil, etc)   On the Night Before the Test: Be sure to Drink plenty of water. Do not consume any caffeinated/decaffeinated beverages or chocolate 12 hours prior to your test. Do not take any antihistamines 12 hours prior to your test.   On the Day of the Test: Drink plenty of water until 1 hour prior to the test. Do not eat any food 1 hour prior to test. You may take your regular medications prior to the test.  Take metoprolol (Lopressor)  one ( 1) tablet by mouth ( 50 mg) two hours  prior to test. After the Test: Drink plenty of water. After receiving IV contrast, you may experience a mild flushed feeling. This is normal. On occasion, you may experience a mild rash up to 24 hours after the test. This is not dangerous. If this occurs, you can take Benadryl 25 mg, Zyrtec, Claritin, or Allegra and increase your fluid intake. (Patients taking Tikosyn should avoid Benadryl, and may take Zyrtec, Claritin, or Allegra) If you experience trouble breathing, this can be serious. If it is severe call 911 IMMEDIATELY. If it is mild, please call our office.  We will call to schedule your test 2-4 weeks out understanding that some insurance companies will need an authorization prior to the service being performed.   For more information and frequently asked questions, please visit our website : [email or web address]  For non-scheduling related questions, please contact the cardiac imaging nurse navigator should you have any questions/concerns: Cardiac Imaging Nurse Navigators Direct Office Dial: [phone]   For scheduling needs, including cancellations and rescheduling, please call Brittany, [phone].   Follow-Up: At [facility], you and your health needs are our priority.  As part of our continuing mission to provide you with exceptional heart care, our providers are all part of one team.  This team includes your primary Cardiologist (physician) and Advanced Practice Providers or APPs (Physician Assistants and Nurse Practitioners) who all work together to provide you with the care you need, when you need it.  Your  next appointment:   3 month(s)  Provider:   Rosaline Percy PIETY  We recommend signing up for the patient portal called MyChart.  Sign up information is provided on this After Visit Summary.  MyChart is used to connect with patients for Virtual Visits (Telemedicine).  Patients are able to view lab/test results, encounter notes, upcoming  appointments, etc.  Non-urgent messages can be sent to your provider as well.   To learn more about what you can do with MyChart, go to [email or web address].   Other Instructions   Adopting a Healthy Lifestyle.   Weight: Know what a healthy weight is for you (roughly BMI <25) and aim to maintain this. You can calculate your body mass index on your smart phone. Unfortunately, this is not the most accurate measure of healthy weight, but it is the simplest measurement to use. A more accurate measurement involves body scanning which measures lean muscle, fat tissue and bony density. We do not have this equipment at [facility].    Diet: Aim for 7+ servings of fruits and vegetables daily Limit animal fats in diet for cholesterol and heart health - choose grass fed whenever available Avoid highly processed foods (fast food burgers, tacos, fried chicken, pizza, hot dogs, french fries)  Saturated fat comes in the form of butter, lard, coconut oil, margarine, partially hydrogenated oils, dairy products, and fat in meat. These increase your risk of cardiovascular disease.  Use healthy plant oils, such as olive, canola, soy, corn, sunflower and peanut.  Whole foods such as fruits, vegetables and whole grains have fiber  Men need > 38 grams of fiber per day Women need > 25 grams of fiber per day  Load up on vegetables and fruits - one-half of your plate: Aim for color and variety, and remember that potatoes dont count. Go for whole grains - one-quarter of your plate: Whole wheat, barley, wheat berries, quinoa, oats, brown rice, and foods made with them. If you want pasta, go with whole wheat pasta. Protein power - one-quarter of your plate: Fish, chicken, beans, and nuts are all healthy, versatile protein sources. Limit red meat. You need carbohydrates for energy! The type of carbohydrate is more important than the amount. Choose carbohydrates such as vegetables, fruits, whole grains, beans, and nuts in  the place of white rice, white pasta, potatoes (baked or fried), macaroni and cheese, cakes, cookies, and donuts.  If youre thirsty, drink water. Coffee and tea are good in moderation, but skip sugary drinks and limit milk and dairy products to one or two daily servings. Keep sugar intake at 6 teaspoons or 24 grams or LESS       Exercise: Aim for 150 min of moderate intensity exercise weekly for heart health, and weights twice weekly for bone health Stay active - any steps are better than no steps! Aim for 7-9 hours of sleep daily   Sleep: This provides your body with the reset and relaxation that it needs!  Aim to get 7-8 hours of sleep each night. Limit caffeine, screen time, and other distractions prior to bedtime.  Keep your bedroom cool and dark and do not wear heavy clothing to bed or use heavy bed covers - layer if needed.

## 2023-11-16 NOTE — Progress Notes (Signed)
  Cardiology Office Note:  .   Date:  11/16/2023 ID:  Raymond Guerra, DOB August 09, 1975, MRN [id number] PCP: [facility] Providers Cardiologist:  None { Click to update primary MD,subspecialty MD or APP then REFRESH:1}   Patient Profile: .      PMH Coronary artery disease CT Calcium score  CAC 211 (90th percentile) LAD 147, LCx 24, RCA 40       History of Present Illness: .   Raymond Guerra is a *** 48 y.o. male  who is here today for new patient consult for ***  Lipid panel 5/17  Did lab panel with [facility] with [facility] [employer] through work  Started statin 11/2023  Family history: His family history includes Coronary artery disease in his father; Heart disease in his maternal grandfather and paternal grandfather; Valvular heart disease in his mother.   Discussed the use of AI scribe software for clinical note transcription with the patient, who gave verbal consent to proceed.  ASCVD Risk Score: The 10-year ASCVD risk score (Arnett DK, et al., 2019) is: 1.8%   Values used to calculate the score:     Age: 48 years     Clincally relevant sex: Male     Is Non-Hispanic African American: No     Diabetic: No     Tobacco smoker: No     Systolic Blood Pressure: 116 mmHg     Is BP treated: No     HDL Cholesterol: 54 mg/dL     Total Cholesterol: 189 mg/dL   Diet: Enjoys all foods - veggies, fruits Has reduced carbs, sweets, red meat since CT   Activity: Works at [employer] Enjoys traveling Runs 3-6 miles, [employer] lifting/resistance Golf    No results found for: LIPOA    ROS: See HPI       Studies Reviewed: SABRA   EKG Interpretation Date/Time:  Wednesday November 16 2023 14:26:27 EST Ventricular Rate:  63 PR Interval:  170 QRS Duration:  104 QT Interval:  404 QTC Calculation: 413 R Axis:   85  Text Interpretation: Normal sinus rhythm Normal ECG No previous ECGs available Confirmed by Percy Browning [phone]) on 11/16/2023  2:31:19 PM      Risk Assessment/Calculations:             Physical Exam:   VS: BP 116/84   Pulse 63   Ht 6' (1.829 m)   Wt 198 lb 9.6 oz (90.1 kg)   SpO2 98%   BMI 26.94 kg/m   Wt Readings from Last 3 Encounters:  11/16/23 198 lb 9.6 oz (90.1 kg)  09/20/22 199 lb (90.3 kg)  08/26/22 195 lb (88.5 kg)     GEN: Well nourished, well developed in no acute distress NECK: No JVD; No carotid bruits CARDIAC: ***RRR, no murmurs, rubs, gallops RESPIRATORY:  Clear to auscultation without rales, wheezing or rhonchi  ABDOMEN: Soft, non-tender, non-distended EXTREMITIES:  No edema; No deformity     ASSESSMENT AND PLAN: .     Plan/Goals:{ Click here to update goals :1}        {Are you ordering a CV Procedure (e.g. stress test, cath, DCCV, TEE, etc)?   Press F2        :[id number]}  Dispo: ***  Signed, Browning Percy, NP-C

## 2023-11-18 ENCOUNTER — Encounter (HOSPITAL_BASED_OUTPATIENT_CLINIC_OR_DEPARTMENT_OTHER): Payer: Self-pay | Admitting: Nurse Practitioner

## 2023-12-06 ENCOUNTER — Encounter (HOSPITAL_COMMUNITY): Payer: Self-pay

## 2023-12-08 ENCOUNTER — Ambulatory Visit (HOSPITAL_BASED_OUTPATIENT_CLINIC_OR_DEPARTMENT_OTHER): Payer: Self-pay | Admitting: Nurse Practitioner

## 2023-12-08 ENCOUNTER — Ambulatory Visit (HOSPITAL_COMMUNITY)
Admission: RE | Admit: 2023-12-08 | Discharge: 2023-12-08 | Disposition: A | Source: Ambulatory Visit | Attending: Nurse Practitioner | Admitting: Nurse Practitioner

## 2023-12-08 DIAGNOSIS — R0602 Shortness of breath: Secondary | ICD-10-CM | POA: Insufficient documentation

## 2023-12-08 DIAGNOSIS — I2510 Atherosclerotic heart disease of native coronary artery without angina pectoris: Secondary | ICD-10-CM | POA: Diagnosis not present

## 2023-12-08 MED ORDER — NITROGLYCERIN 0.4 MG SL SUBL
0.8000 mg | SUBLINGUAL_TABLET | Freq: Once | SUBLINGUAL | Status: AC
  Administered 2023-12-08: 0.8 mg via SUBLINGUAL

## 2023-12-08 MED ORDER — IOHEXOL 350 MG/ML SOLN
100.0000 mL | Freq: Once | INTRAVENOUS | Status: AC | PRN
  Administered 2023-12-08: 100 mL via INTRAVENOUS

## 2023-12-22 ENCOUNTER — Institutional Professional Consult (permissible substitution) (HOSPITAL_BASED_OUTPATIENT_CLINIC_OR_DEPARTMENT_OTHER): Admitting: Nurse Practitioner

## 2024-01-03 DIAGNOSIS — I2510 Atherosclerotic heart disease of native coronary artery without angina pectoris: Secondary | ICD-10-CM | POA: Diagnosis not present

## 2024-01-03 DIAGNOSIS — E7800 Pure hypercholesterolemia, unspecified: Secondary | ICD-10-CM | POA: Diagnosis not present

## 2024-01-18 ENCOUNTER — Other Ambulatory Visit (HOSPITAL_BASED_OUTPATIENT_CLINIC_OR_DEPARTMENT_OTHER): Payer: Self-pay | Admitting: *Deleted

## 2024-01-18 DIAGNOSIS — Z8249 Family history of ischemic heart disease and other diseases of the circulatory system: Secondary | ICD-10-CM

## 2024-01-18 DIAGNOSIS — E785 Hyperlipidemia, unspecified: Secondary | ICD-10-CM

## 2024-01-18 DIAGNOSIS — Z7189 Other specified counseling: Secondary | ICD-10-CM

## 2024-01-18 DIAGNOSIS — I2510 Atherosclerotic heart disease of native coronary artery without angina pectoris: Secondary | ICD-10-CM

## 2024-01-18 MED ORDER — EZETIMIBE 10 MG PO TABS: 10.0000 mg | ORAL_TABLET | Freq: Every day | ORAL | 3 refills | Status: AC

## 2024-01-18 NOTE — Addendum Note (Signed)
 Addended by: PERCY ROSALINE HERO on: 01/18/2024 05:26 AM   Modules accepted: Orders

## 2024-02-13 ENCOUNTER — Ambulatory Visit (HOSPITAL_BASED_OUTPATIENT_CLINIC_OR_DEPARTMENT_OTHER): Admitting: Nurse Practitioner

## 2024-04-23 ENCOUNTER — Ambulatory Visit (HOSPITAL_BASED_OUTPATIENT_CLINIC_OR_DEPARTMENT_OTHER): Admitting: Nurse Practitioner
